# Patient Record
Sex: Female | Born: 2005 | Hispanic: Yes | Marital: Single | Smoking: Never smoker
Health system: Southern US, Community
[De-identification: ages and names within clinical notes are randomized; demographics above are authoritative.]

---
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
## 2024-01-04 IMAGING — CR DG KNEE COMPLETE 4+V*L*
4 series · 4 of 4 positions shown · non-contrast
Comparison: None.

CLINICAL DATA: Knee pain

EXAM:
LEFT KNEE - COMPLETE 4 VIEW

[knee ap]
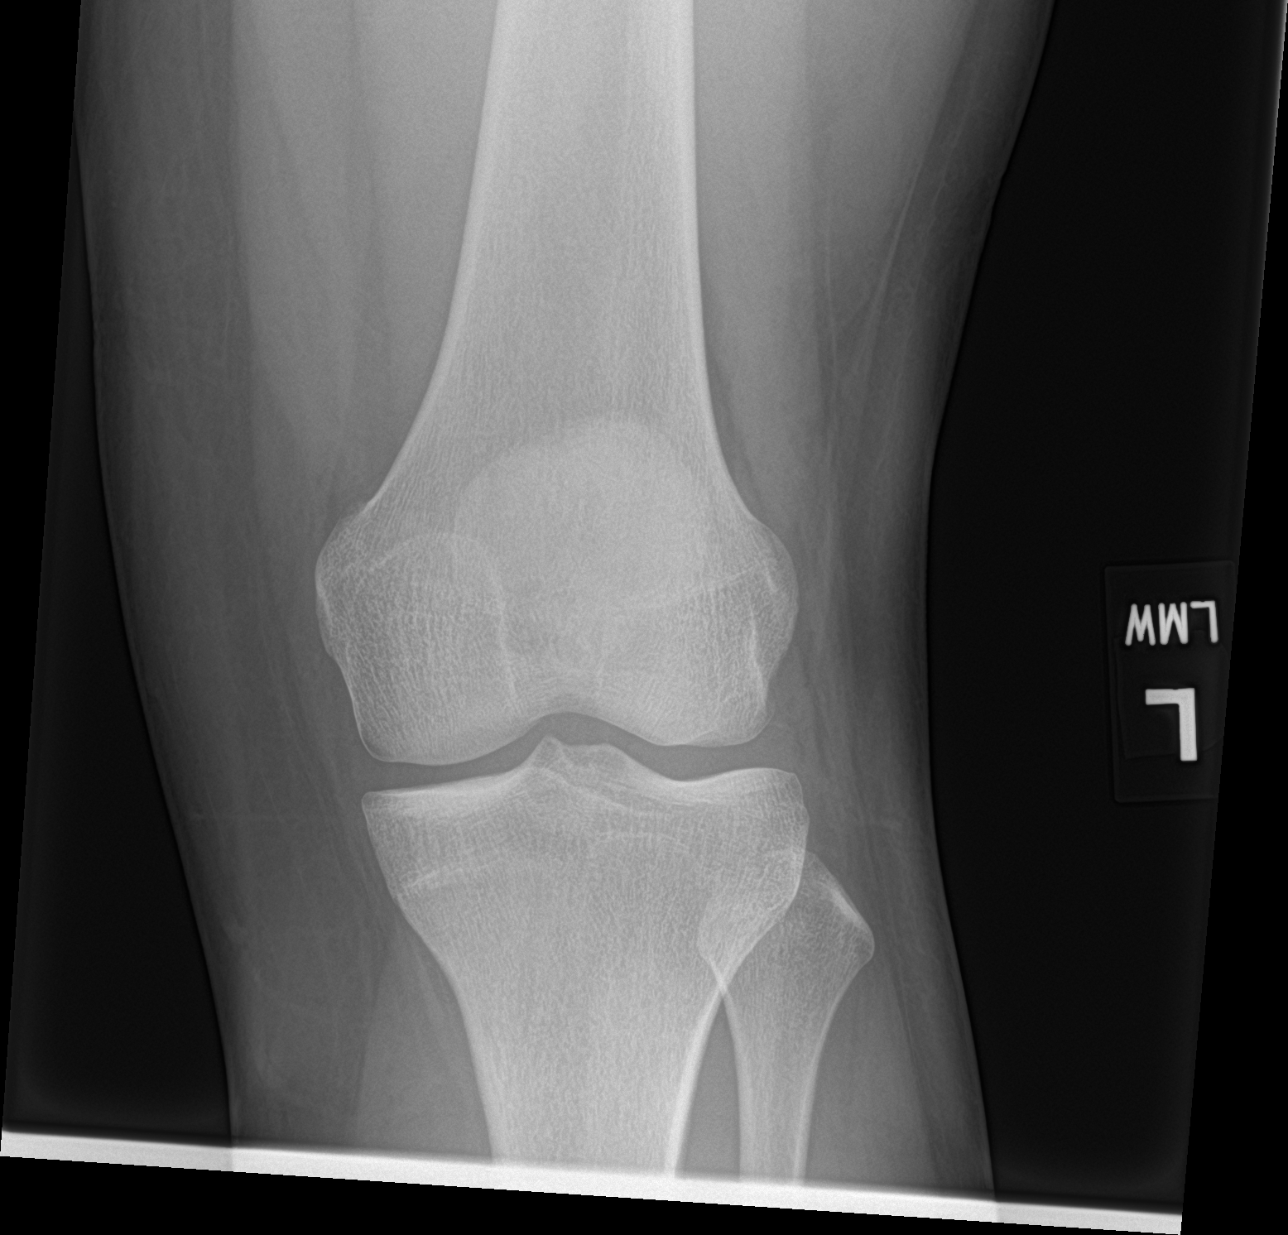

[knee obl (1 of 2)]
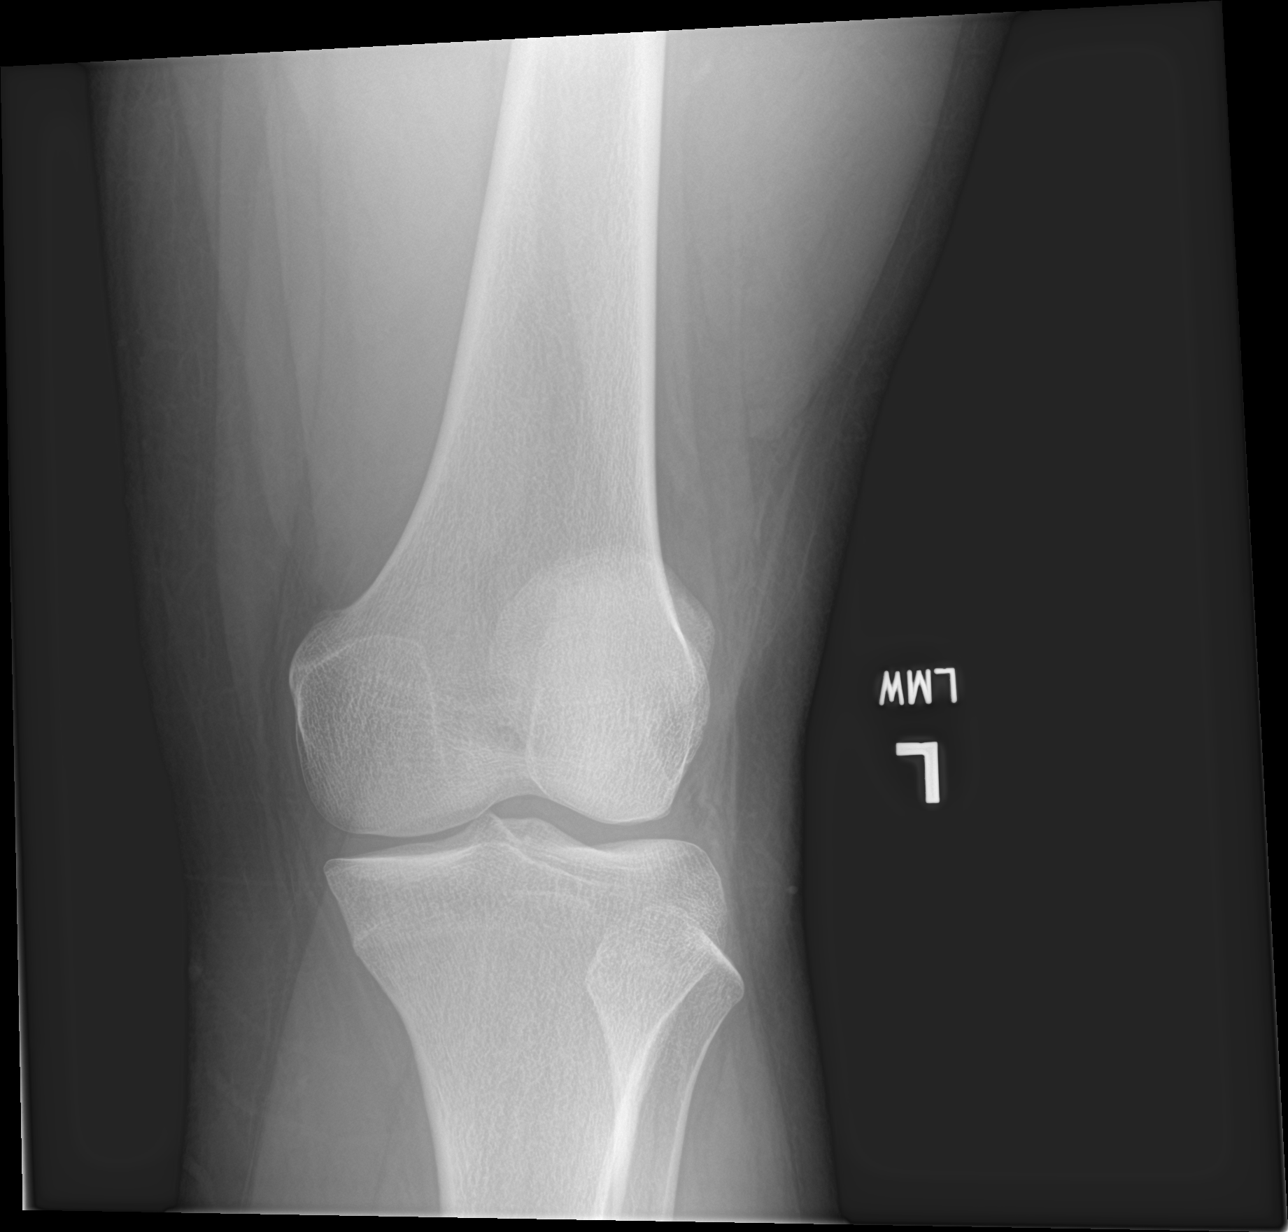

[knee obl (2 of 2)]
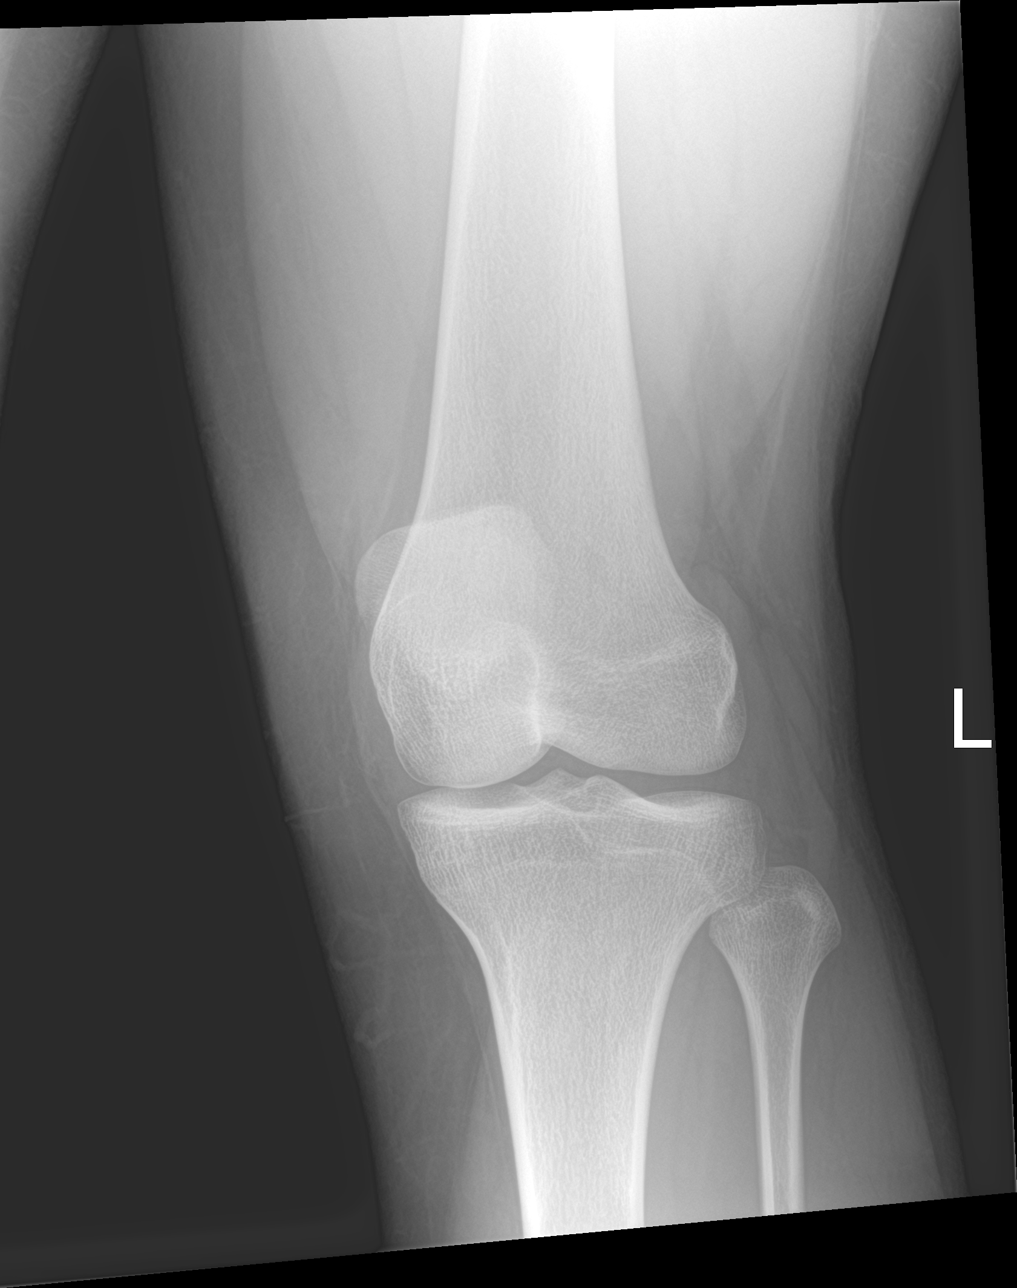

[knee lat]
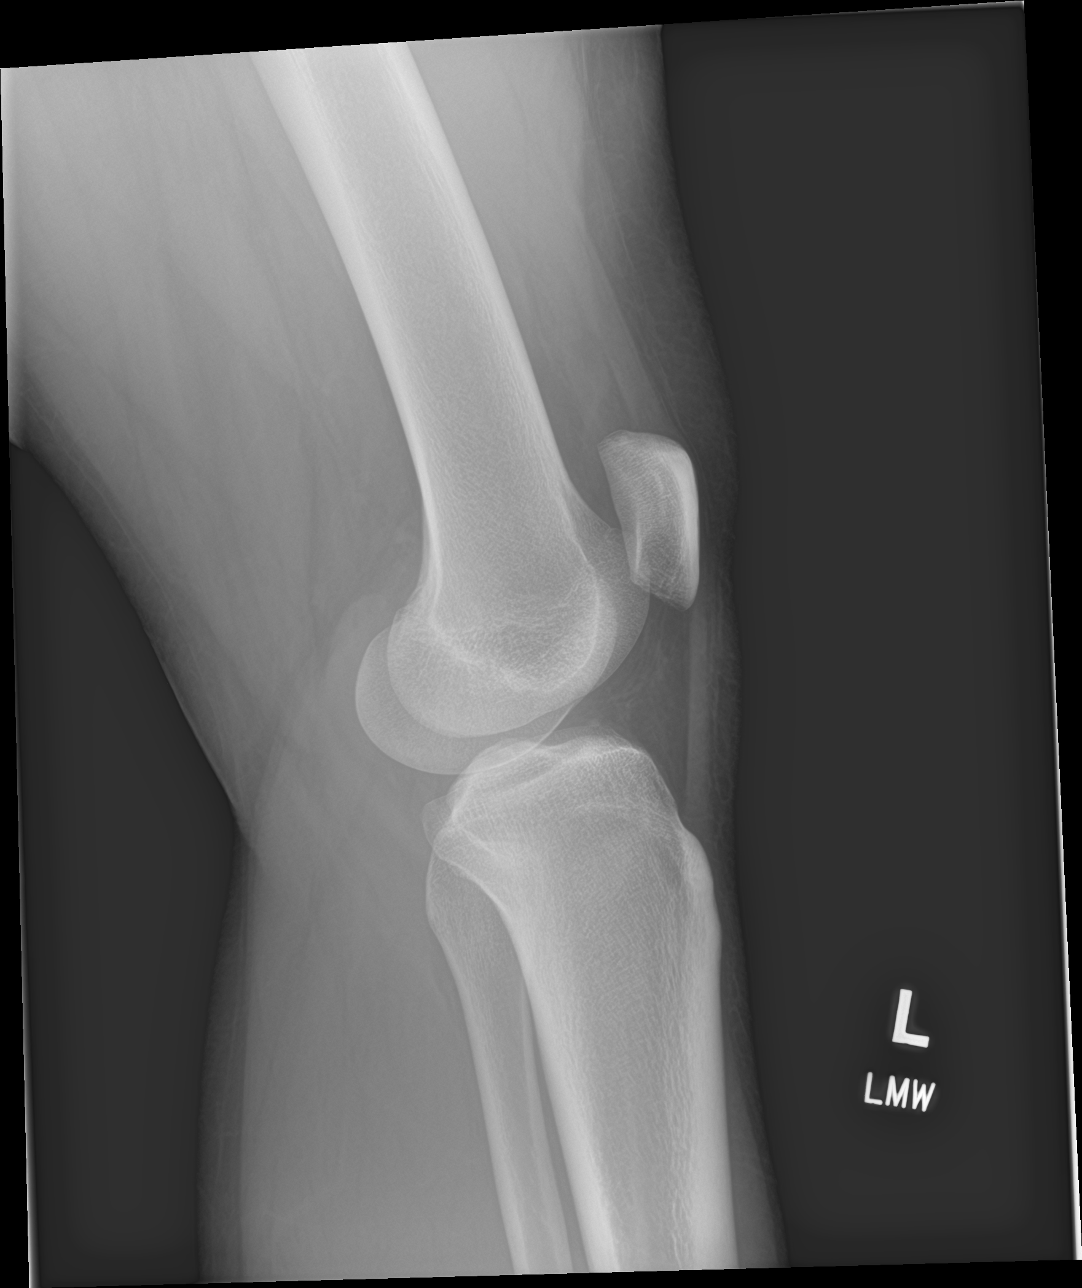

[4 of 4 positions shown; findings below may reference images not displayed]

FINDINGS: No evidence of fracture, dislocation, or joint effusion. No evidence
of arthropathy or other focal bone abnormality. Soft tissues are
unremarkable.
IMPRESSION: Negative.

## 2024-07-05 ENCOUNTER — Emergency Department
Admission: EM | Admit: 2024-07-05 | Discharge: 2024-07-06 | Disposition: A | Discharge status: Home / Self Care | Attending: Emergency Medicine | Admitting: Emergency Medicine

## 2024-07-05 ENCOUNTER — Encounter: Payer: Self-pay | Admitting: Emergency Medicine

## 2024-07-05 ENCOUNTER — Other Ambulatory Visit: Payer: Self-pay

## 2024-07-05 DIAGNOSIS — D72829 Elevated white blood cell count, unspecified: Secondary | ICD-10-CM | POA: Diagnosis not present

## 2024-07-05 DIAGNOSIS — R55 Syncope and collapse: Secondary | ICD-10-CM | POA: Diagnosis present

## 2024-07-05 LAB — CBC
HCT: 39.3 % (ref 36.0–46.0)
Hemoglobin: 12.9 g/dL (ref 12.0–15.0)
MCH: 27.7 pg (ref 26.0–34.0)
MCHC: 32.8 g/dL (ref 30.0–36.0)
MCV: 84.3 fL (ref 80.0–100.0)
Platelets: 248 K/uL (ref 150–400)
RBC: 4.66 MIL/uL (ref 3.87–5.11)
RDW: 13.2 % (ref 11.5–15.5)
WBC: 10.6 K/uL — ABNORMAL HIGH (ref 4.0–10.5)
nRBC: 0 % (ref 0.0–0.2)

## 2024-07-05 LAB — CBG MONITORING, ED: Glucose-Capillary: 111 mg/dL — ABNORMAL HIGH (ref 70–99)

## 2024-07-05 LAB — COMPREHENSIVE METABOLIC PANEL WITH GFR
ALT: 14 U/L (ref 0–44)
AST: 22 U/L (ref 15–41)
Albumin: 4.1 g/dL (ref 3.5–5.0)
Alkaline Phosphatase: 88 U/L (ref 38–126)
Anion gap: 6 (ref 5–15)
BUN: 13 mg/dL (ref 6–20)
CO2: 25 mmol/L (ref 22–32)
Calcium: 8.9 mg/dL (ref 8.9–10.3)
Chloride: 105 mmol/L (ref 98–111)
Creatinine, Ser: 0.74 mg/dL (ref 0.44–1.00)
GFR, Estimated: >60 mL/min (ref >60)
Glucose, Bld: 103 mg/dL — ABNORMAL HIGH (ref 70–99)
Potassium: 3.8 mmol/L (ref 3.5–5.1)
Sodium: 136 mmol/L (ref 135–145)
Total Bilirubin: 0.6 mg/dL (ref 0.0–1.2)
Total Protein: 8.1 g/dL (ref 6.5–8.1)

## 2024-07-05 LAB — POC URINE PREG, ED: Preg Test, Ur: NEGATIVE

## 2024-07-05 LAB — URINALYSIS, ROUTINE W REFLEX MICROSCOPIC
Bilirubin Urine: NEGATIVE
Glucose, UA: NEGATIVE mg/dL
Hgb urine dipstick: NEGATIVE
Ketones, ur: NEGATIVE mg/dL
Leukocytes,Ua: NEGATIVE
Nitrite: NEGATIVE
Protein, ur: NEGATIVE mg/dL
Specific Gravity, Urine: 1.017 (ref 1.005–1.030)
pH: 5 (ref 5.0–8.0)

## 2024-07-05 NOTE — ED Provider Notes (Signed)
 [facility] Provider Note    Event Date/Time   First MD Initiated Contact with Patient 07/05/24 2343     (approximate)   History   Near Syncope   HPI Donna Charles is a 18 y.o. female who presents for evaluation of a near syncopal episode at work.  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  She was able to get to a break room but then she thinks she may have passed out or come very close to it.  Symptoms have completely resolved.  She did not fall or injure herself in any way.  She had some chest tightness when the event happened but no chest pain or difficulty breathing.  She has had no unilateral leg pain or swelling.  No recent travel or immobilization.  She has been asymptomatic for about 5 hours or more since the episode.     Physical Exam   Triage Vital Signs: ED Triage Vitals [07/05/24 1955]  Encounter Vitals Group     BP 122/69     Girls Systolic BP Percentile      Girls Diastolic BP Percentile      Boys Systolic BP Percentile      Boys Diastolic BP Percentile      Pulse Rate 87     Resp 18     Temp 98.6 F (37 C)     Temp Source Oral     SpO2 100 %     Weight 77.1 kg (170 lb)     Height 1.6 m (5' 3)     Head Circumference      Peak Flow      Pain Score 0     Pain Loc      Pain Education      Exclude from Growth Chart     Most recent vital signs: Vitals:   07/05/24 1955 07/06/24 0005  BP: 122/69 96/77  Pulse: 87 75  Resp: 18 16  Temp: 98.6 F (37 C) (!) 97.4 F (36.3 C)  SpO2: 100% 100%    General: Awake, no distress.  Well-appearing.   CV:  Good peripheral perfusion.  Regular rate and rhythm, normal heart sounds. Resp:  Normal effort. Speaking easily and comfortably, no accessory muscle usage nor intercostal retractions.  Lungs are clear to auscultation bilaterally with no wheezes, rales, nor rhonchi. Abd:  No distention.   No tenderness to palpation of the abdomen. Other:  Ambulatory without difficulty.   ED Results / Procedures / Treatments   Labs (all labs ordered are listed, but only abnormal results are displayed) Labs Reviewed  COMPREHENSIVE METABOLIC PANEL WITH GFR - Abnormal; Notable for the following components:      Result Value   Glucose, Bld 103 (*)    All other components within normal limits  CBC - Abnormal; Notable for the following components:   WBC 10.6 (*)    All other components within normal limits  URINALYSIS, ROUTINE W REFLEX MICROSCOPIC - Abnormal; Notable for the following components:   Color, Urine YELLOW (*)    APPearance CLEAR (*)    All other components within normal limits  CBG MONITORING, ED - Abnormal; Notable for the following components:   Glucose-Capillary 111 (*)    All other components within normal limits  POC URINE PREG, ED     EKG  ED ECG REPORT I, Darleene  Gordan, the attending physician, personally viewed and interpreted this ECG.  Date: 07/05/2024 EKG Time: 20: 01 Rate: 74 Rhythm: normal sinus rhythm QRS Axis: normal Intervals: normal ST/T Wave abnormalities: normal Narrative Interpretation: no evidence of acute ischemia    PROCEDURES:  Critical Care performed: No  Procedures    IMPRESSION / MDM / ASSESSMENT AND PLAN / ED COURSE  I reviewed the triage vital signs and the nursing notes.                              Differential diagnosis includes, but is not limited to, vasovagal episode, orthostatic episode, pregnancy, electrolyte abnormality, dehydration.  Less likely cardiac arrhythmia or PE.  Patient's presentation is most consistent with acute presentation with potential threat to life or bodily function.  Labs/studies ordered: Urinalysis, urine pregnancy test, CMP, CBC, EKG  Interventions/Medications given:  Medications - No data to display  (Note:  hospital course my include additional interventions and/or labs/studies not  listed above.)   Reassuring evaluation and workup.  Patient is asymptomatic and ready to go.  No acute abnormalities identified on lab work.  100% on room air, no tachycardia, no tachypnea.  PERC negative.  No abnormalities on EKG.  Patient is comfortable with the plan for discharge and outpatient follow-up and I think that is reasonable and appropriate.  The patient's medical screening exam is reassuring with no indication of an emergent medical condition requiring hospitalization or additional evaluation at this point.  The patient is safe and appropriate for discharge and outpatient follow up.         FINAL CLINICAL IMPRESSION(S) / ED DIAGNOSES   Final diagnoses:  Near syncope     Rx / DC Orders   ED Discharge Orders     None        Note:  This document was prepared using Dragon voice recognition software and may include unintentional dictation errors.   Gordan Huxley, MD 07/06/24 [phone]

## 2024-07-05 NOTE — ED Triage Notes (Signed)
 Patient reports having a near syncopal event at work where she felt hot, chest tightness, and blurred vision.  Symptoms have resolved, patient did not actually have LOC.

## 2024-07-06 NOTE — Discharge Instructions (Signed)
 You have been seen today in the Emergency Department (ED)  for syncope (passing out).  Your workup including labs and EKG show reassuring results.  Your symptoms may be due to dehydration, so it is important that you drink plenty of non-alcoholic fluids.  Please call your regular doctor as soon as possible to schedule the next available clinic appointment to follow up with him/her regarding your visit to the ED and your symptoms.  Return to the Emergency Department (ED)  if you have any further syncopal or near-syncopal episodes (pass out again) or develop any chest pain, pressure, tightness, trouble breathing, sudden sweating, or other symptoms that concern you.
